# Patient Record
Sex: Female | Born: 1980 | Race: Black or African American | Hispanic: No | Marital: Single | Smoking: Never smoker
Health system: Southern US, Community
[De-identification: ages and names within clinical notes are randomized; demographics above are authoritative.]

---

## 2018-10-17 ENCOUNTER — Emergency Department (HOSPITAL_COMMUNITY)
Admission: EM | Admit: 2018-10-17 | Discharge: 2018-10-17 | Disposition: A | Discharge status: Home / Self Care | Payer: Self-pay | Attending: Emergency Medicine | Admitting: Emergency Medicine

## 2018-10-17 ENCOUNTER — Encounter (HOSPITAL_COMMUNITY): Payer: Self-pay | Admitting: Pharmacy Technician

## 2018-10-17 ENCOUNTER — Other Ambulatory Visit: Payer: Self-pay

## 2018-10-17 ENCOUNTER — Emergency Department (HOSPITAL_COMMUNITY): Payer: Self-pay

## 2018-10-17 DIAGNOSIS — R0789 Other chest pain: Secondary | ICD-10-CM | POA: Insufficient documentation

## 2018-10-17 NOTE — ED Triage Notes (Signed)
Pt reports L ribcage pain onset 4-5 days ago. States the L side of her chest feels heavy. Worse with deep inspiration and palpation.

## 2018-10-17 NOTE — ED Notes (Signed)
Patient transported to X-ray 

## 2018-10-17 NOTE — ED Provider Notes (Signed)
[facility]   CSN: [id number] Arrival date & time: 10/17/18  1338     History   Chief Complaint Chief Complaint  Patient presents with  . Chest Pain    HPI Mallory Cain is a 37 y.o. female.  As of left-sided posterior lateral chest pain onset gradually 5 days ago pain is worse with deep inspiration or with changing positions, improved with remaining still.  Nonexertional.  She denies any shortness of breath nausea or sweatiness.  No treatment prior to coming here.  She describes the pain as sharp.  Eyes cough denies fever denies other associated symptoms  HPI  No past medical history on file.  There are no active problems to display for this patient.   History reviewed. No pertinent surgical history.   OB History   None      Home Medications    Prior to Admission medications   Not on File    Family History No family history on file.  Social History Social History   Tobacco Use  . Smoking status: Never Smoker  . Smokeless tobacco: Never Used  Substance Use Topics  . Alcohol use: Never    Frequency: Never  . Drug use: Never     Allergies   Patient has no known allergies.   Review of Systems Review of Systems  Constitutional: Negative.   HENT: Negative.   Respiratory: Negative.   Cardiovascular: Positive for chest pain.  Gastrointestinal: Negative.   Musculoskeletal: Negative.   Skin: Negative.   Neurological: Negative.   Psychiatric/Behavioral: Negative.   All other systems reviewed and are negative.    Physical Exam Updated Vital Signs BP 111/70   Pulse 70   Temp 99 F (37.2 C) (Oral)   Resp 17   Ht 5' (1.524 m)   Wt 67.1 kg   LMP 09/15/2018 (Approximate)   SpO2 100%   BMI 28.90 kg/m   Physical Exam  Constitutional: She appears well-developed and well-nourished.  HENT:  Head: Normocephalic and atraumatic.  Eyes: Pupils are equal, round, and reactive to light. Conjunctivae  are normal.  Neck: Neck supple. No tracheal deviation present. No thyromegaly present.  Cardiovascular: Normal rate, regular rhythm, normal heart sounds and intact distal pulses. Exam reveals no friction rub.  No murmur heard. Pulmonary/Chest: Effort normal and breath sounds normal. She exhibits tenderness.  Chest is tender in mid axillary line left side and left sided chest tenderness posterior lateral aspect, reproducing pain exactly.  Pain is also reproduced by forcible flexion of left shoulder  Abdominal: Soft. Bowel sounds are normal. She exhibits no distension. There is no tenderness.  Musculoskeletal: Normal range of motion. She exhibits no edema or tenderness.  Neurological: She is alert. Coordination normal.  Skin: Skin is warm and dry. No rash noted.  Psychiatric: She has a normal mood and affect.  Nursing note and vitals reviewed.    ED Treatments / Results  Labs (all labs ordered are listed, but only abnormal results are displayed) Labs Reviewed - No data to display  EKG EKG Interpretation  Date/Time:  Thursday October 17 2018 13:56:15 EST Ventricular Rate:  86 PR Interval:    QRS Duration: 74 QT Interval:  339 QTC Calculation: 406 R Axis:   41 Text Interpretation:  Sinus rhythm Borderline T abnormalities, diffuse leads No old tracing to compare Confirmed by IcardJacubowitz, Doreatha MartinSam [phone]([id number]) on 10/17/2018 2:04:31 PM   Radiology No results found.  Procedures Procedures (including critical care time)  Medications Ordered in ED Medications - No data to display  Declines pain medicine Chest x-ray viewed by me No results found for this or any previous visit. Dg Chest 2 View  Result Date: 10/17/2018 CLINICAL DATA:  Left rib and chest pain. EXAM: CHEST - 2 VIEW COMPARISON:  No prior. FINDINGS: Mediastinum hilar structures normal. Lungs are clear. No pleural effusion pneumothorax. Heart size normal. No acute bony abnormality. Mild thoracolumbar spine concave left.  IMPRESSION: No acute cardiopulmonary disease. Electronically Signed   By: Maisie Fus  Register   On: 10/17/2018 15:44   Initial Impression / Assessment and Plan / ED Course  I have reviewed the triage vital signs and the nursing notes.  Pertinent labs & imaging results that were available during my care of the patient were reviewed by me and considered in my medical decision making (see chart for details).     History and exam consistent with chest wall pain.  Plan Tylenol or Advil for pain.  Referral primary care.  Final Clinical Impressions(s) / ED Diagnoses  Diagnosis chest wall pain Final diagnoses:  None    ED Discharge Orders    None       Doug Sou, MD 10/17/18 [phone]

## 2018-10-17 NOTE — Discharge Instructions (Addendum)
Take Tylenol or Advil as directed for pain.  You can call the number on these instructions to get a primary care physician.  See your primary care physician or an urgent care center if you are not feeling better in a week.  Return if your condition worsens or if you are concerned for any reason

## 2019-08-05 IMAGING — CR DG CHEST 2V
2 series · 2 of 2 positions shown · non-contrast
Comparison: No prior.

CLINICAL DATA: Left rib and chest pain.

EXAM:
CHEST - 2 VIEW

[chest pa]
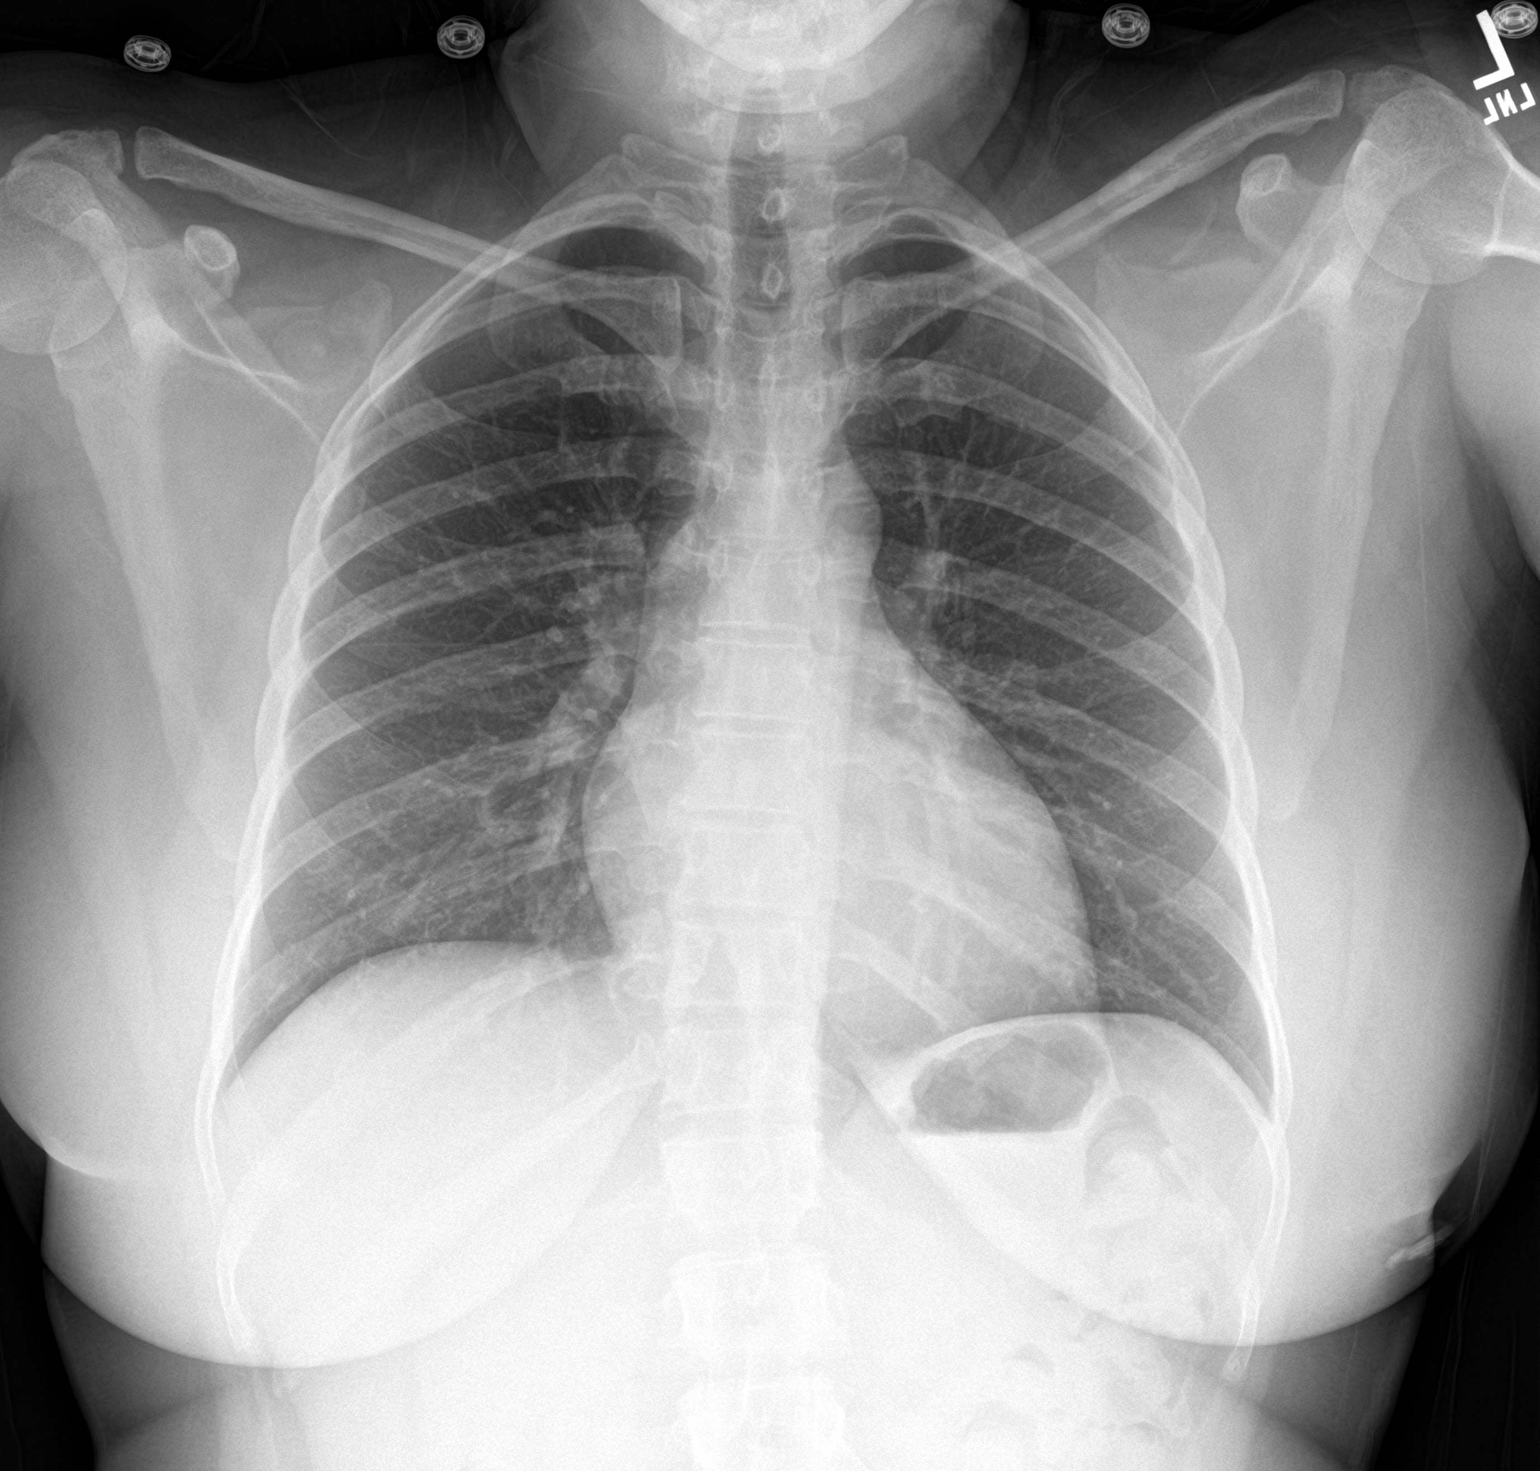

[chest lat]
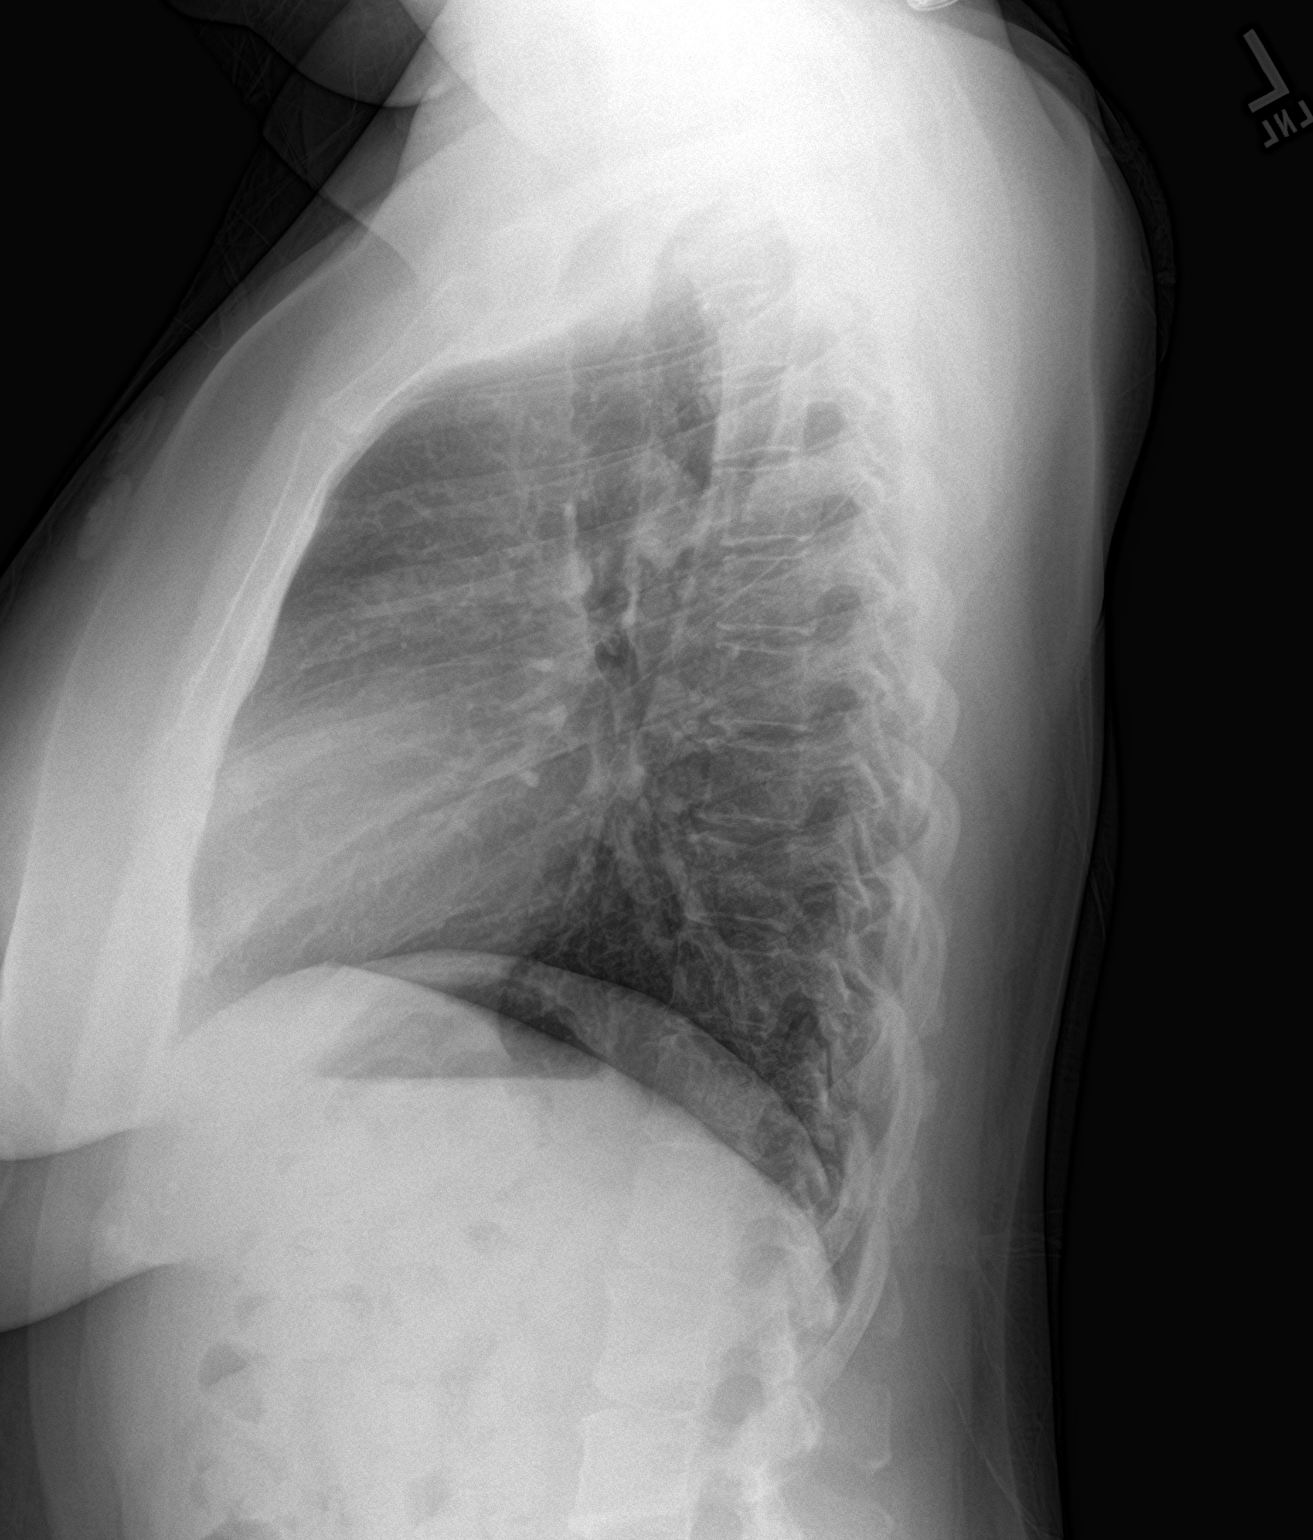

[2 of 2 positions shown; findings below may reference images not displayed]

FINDINGS: Mediastinum hilar structures normal. Lungs are clear. No pleural
effusion pneumothorax. Heart size normal. No acute bony abnormality.
Mild thoracolumbar spine concave left.
IMPRESSION: No acute cardiopulmonary disease.

## 2019-09-03 ENCOUNTER — Other Ambulatory Visit: Payer: Self-pay

## 2019-09-03 DIAGNOSIS — Z20822 Contact with and (suspected) exposure to covid-19: Secondary | ICD-10-CM

## 2019-09-05 LAB — NOVEL CORONAVIRUS, NAA: SARS-CoV-2, NAA: NOT DETECTED

## 2020-09-15 ENCOUNTER — Other Ambulatory Visit: Payer: Self-pay | Admitting: Ophthalmology

## 2020-09-15 DIAGNOSIS — H05242 Constant exophthalmos, left eye: Secondary | ICD-10-CM

## 2020-09-15 DIAGNOSIS — H05241 Constant exophthalmos, right eye: Secondary | ICD-10-CM
